# Patient Record
Sex: Male | Born: 1960 | Race: White | Hispanic: No | Marital: Single | Smoking: Current some day smoker
Health system: Southern US, Community
[De-identification: ages and names within clinical notes are randomized; demographics above are authoritative.]

---

## 2016-11-04 ENCOUNTER — Encounter (HOSPITAL_COMMUNITY): Payer: Self-pay | Admitting: Emergency Medicine

## 2016-11-04 ENCOUNTER — Emergency Department (HOSPITAL_COMMUNITY)
Admission: EM | Admit: 2016-11-04 | Discharge: 2016-11-04 | Disposition: A | Discharge status: Home / Self Care | Payer: Self-pay | Attending: Emergency Medicine | Admitting: Emergency Medicine

## 2016-11-04 ENCOUNTER — Emergency Department (HOSPITAL_COMMUNITY): Payer: Self-pay

## 2016-11-04 DIAGNOSIS — F17210 Nicotine dependence, cigarettes, uncomplicated: Secondary | ICD-10-CM | POA: Insufficient documentation

## 2016-11-04 DIAGNOSIS — R1013 Epigastric pain: Secondary | ICD-10-CM | POA: Insufficient documentation

## 2016-11-04 LAB — BASIC METABOLIC PANEL
Anion gap: 9 (ref 5–15)
BUN: 11 mg/dL (ref 6–20)
CALCIUM: 9 mg/dL (ref 8.9–10.3)
CO2: 22 mmol/L (ref 22–32)
CREATININE: 0.81 mg/dL (ref 0.61–1.24)
Chloride: 105 mmol/L (ref 101–111)
Glucose, Bld: 114 mg/dL — ABNORMAL HIGH (ref 65–99)
Potassium: 3.7 mmol/L (ref 3.5–5.1)
SODIUM: 136 mmol/L (ref 135–145)

## 2016-11-04 LAB — CBC
HCT: 45.1 % (ref 39.0–52.0)
Hemoglobin: 16.2 g/dL (ref 13.0–17.0)
MCH: 31.3 pg (ref 26.0–34.0)
MCHC: 35.9 g/dL (ref 30.0–36.0)
MCV: 87.2 fL (ref 78.0–100.0)
PLATELETS: 257 10*3/uL (ref 150–400)
RBC: 5.17 MIL/uL (ref 4.22–5.81)
RDW: 11.9 % (ref 11.5–15.5)
WBC: 8.5 10*3/uL (ref 4.0–10.5)

## 2016-11-04 LAB — I-STAT TROPONIN, ED
TROPONIN I, POC: 0 ng/mL (ref 0.00–0.08)
TROPONIN I, POC: 0.01 ng/mL (ref 0.00–0.08)

## 2016-11-04 LAB — HEPATIC FUNCTION PANEL
ALBUMIN: 4.3 g/dL (ref 3.5–5.0)
ALK PHOS: 70 U/L (ref 38–126)
ALT: 25 U/L (ref 17–63)
AST: 29 U/L (ref 15–41)
Bilirubin, Direct: <0.1 mg/dL — ABNORMAL LOW (ref 0.1–0.5)
TOTAL PROTEIN: 7.6 g/dL (ref 6.5–8.1)
Total Bilirubin: 0.8 mg/dL (ref 0.3–1.2)

## 2016-11-04 LAB — LIPASE, BLOOD: LIPASE: 27 U/L (ref 11–51)

## 2016-11-04 MED ORDER — PANTOPRAZOLE SODIUM 40 MG IV SOLR
40.0000 mg | Freq: Once | INTRAVENOUS | Status: AC
  Administered 2016-11-04: 40 mg via INTRAVENOUS
  Filled 2016-11-04: qty 40

## 2016-11-04 MED ORDER — OMEPRAZOLE 20 MG PO CPDR: 20.0000 mg | DELAYED_RELEASE_CAPSULE | Freq: Every day | ORAL | 0 refills | Status: AC

## 2016-11-04 MED ORDER — BUPIVACAINE HCL 0.25 % IJ SOLN: 50.0000 mL | Freq: Once | INTRAMUSCULAR | Status: DC

## 2016-11-04 NOTE — ED Triage Notes (Signed)
Chest pain started approx 0200 with arms shaking, pain in right arm and right side of neck, epigastric.

## 2016-11-04 NOTE — ED Triage Notes (Signed)
States currently in no pain, "arms feel numb"

## 2016-11-04 NOTE — ED Notes (Signed)
C/o epigastric pain , states he got up to go to the bathroom 4-5 times last pm states he had diarrhea, c/o tingling in hands and fingers, States the pain was intermittent and sharp in nature. Denies radiation denies sob or n/v

## 2016-11-04 NOTE — ED Provider Notes (Signed)
[facility]-EMERGENCY DEPT Provider Note   CSN: [id number] Arrival date & time: 11/04/16  0732     History   Chief Complaint Chief Complaint  Patient presents with  . Chest Pain    HPI Manuel Berry is a 56 y.o. male.  Patient is a 56 year old male who presents with chest pain. He states he woke up during the night several times because he was having some loose stools. He states during this time he noted some pain in his epigastrium. He also notes that both of his hands were tingling. There is no associated shortness of breath. No other chest pain. He felt like it was little knives stabbing him in the epigastrium. He denies any of those symptoms now. No abdominal pain currently. His finger still felt tingly bilaterally. There is no weakness in his extremities. No nausea or vomiting. He did drink several beers yesterday and the day before.  No history of heart disease in the past. He does have a family history and his dad had a heart attack at age 9. He is an occasional smoker. He denies any known history of heart disease, hypertension or hyperlipidemia although he has not had his levels checked in a while.      History reviewed. No pertinent past medical history.  There are no active problems to display for this patient.   History reviewed. No pertinent surgical history.     Home Medications    Prior to Admission medications   Medication Sig Start Date End Date Taking? Authorizing Provider  omeprazole (PRILOSEC) 20 MG capsule Take 1 capsule (20 mg total) by mouth daily. 11/04/16   Rolan Bucco, MD    Family History No family history on file.  Social History Social History  Substance Use Topics  . Smoking status: Current Some Day Smoker    Types: Cigarettes  . Smokeless tobacco: Never Used  . Alcohol use Yes     Comment: weekends     Allergies   Patient has no known allergies.   Review of Systems Review of Systems  Constitutional: Negative for chills,  diaphoresis, fatigue and fever.  HENT: Negative for congestion, rhinorrhea and sneezing.   Eyes: Negative.   Respiratory: Negative for cough, chest tightness and shortness of breath.   Cardiovascular: Positive for chest pain. Negative for leg swelling.  Gastrointestinal: Positive for abdominal pain. Negative for blood in stool, diarrhea, nausea and vomiting.  Genitourinary: Negative for difficulty urinating, flank pain, frequency and hematuria.  Musculoskeletal: Negative for arthralgias and back pain.  Skin: Negative for rash.  Neurological: Positive for numbness. Negative for dizziness, speech difficulty, weakness and headaches.     Physical Exam Updated Vital Signs BP (!) 174/105   Pulse 72   Temp (!) 97.2 F (36.2 C) (Oral)   Resp (!) 22   Ht 5\' 10"  (1.778 m)   Wt 77.1 kg (170 lb)   SpO2 98%   BMI 24.39 kg/m   Physical Exam  Constitutional: He is oriented to person, place, and time. He appears well-developed and well-nourished.  HENT:  Head: Normocephalic and atraumatic.  Eyes: Pupils are equal, round, and reactive to light.  Neck: Normal range of motion. Neck supple.  Cardiovascular: Normal rate, regular rhythm and normal heart sounds.   Pulmonary/Chest: Effort normal and breath sounds normal. No respiratory distress. He has no wheezes. He has no rales. He exhibits no tenderness.  Abdominal: Soft. Bowel sounds are normal. There is no tenderness. There is no rebound and no guarding.  Musculoskeletal: Normal range of motion. He exhibits no edema.  Lymphadenopathy:    He has no cervical adenopathy.  Neurological: He is alert and oriented to person, place, and time.  Skin: Skin is warm and dry. No rash noted.  Psychiatric: He has a normal mood and affect.     ED Treatments / Results  Labs (all labs ordered are listed, but only abnormal results are displayed) Labs Reviewed  BASIC METABOLIC PANEL - Abnormal; Notable for the following:       Result Value   Glucose, Bld  114 (*)    All other components within normal limits  HEPATIC FUNCTION PANEL - Abnormal; Notable for the following:    Bilirubin, Direct <0.1 (*)    All other components within normal limits  CBC  LIPASE, BLOOD  I-STAT TROPONIN, ED  I-STAT TROPONIN, ED    EKG  EKG Interpretation  Date/Time:  Monday November 04 2016 07:38:24 EDT Ventricular Rate:  68 PR Interval:  126 QRS Duration: 106 QT Interval:  434 QTC Calculation: 461 R Axis:   2 Text Interpretation:  Normal sinus rhythm Normal ECG Confirmed by Rolan BuccoBelfi, Breta Demedeiros [phone]([id number]) on 11/04/2016 7:55:07 AM       Radiology Dg Chest 2 View  Result Date: 11/04/2016 CLINICAL DATA:  Chest pain EXAM: CHEST  2 VIEW COMPARISON:  None. FINDINGS: The lungs are clear. Heart size and pulmonary vascularity are normal. No adenopathy. No pneumothorax. No bone lesions. IMPRESSION: No edema or consolidation. Electronically Signed   By: Bretta BangWilliam  Woodruff III M.D.   On: 11/04/2016 07:58    Procedures Procedures (including critical care time)  Medications Ordered in ED Medications  pantoprazole (PROTONIX) injection 40 mg (40 mg Intravenous Given 11/04/16 0931)     Initial Impression / Assessment and Plan / ED Course  I have reviewed the triage vital signs and the nursing notes.  Pertinent labs & imaging results that were available during my care of the patient were reviewed by me and considered in my medical decision making (see chart for details).     Patient is a 56 year old male who presents with some epigastric pain. There is no shortness of breath. No exertional symptoms. No ischemic changes on EKG. He's had 2 negative troponins. He has a low heart score. I feel that he can be discharged with outpatient follow-up. I will start him on omeprazole. I gave him a list of resources for possible outpatient follow-up. He was given strict return precautions. His blood pressure is noted to be elevated. His discharge blood pressure was 152/94. I advised him to  pick up a blood pressure machine that he can check his blood pressures at home and to bring this record to a primary care doctor, particularly if it remains high.  Final Clinical Impressions(s) / ED Diagnoses   Final diagnoses:  Epigastric pain    New Prescriptions New Prescriptions   OMEPRAZOLE (PRILOSEC) 20 MG CAPSULE    Take 1 capsule (20 mg total) by mouth daily.     Rolan BuccoBelfi, Lakie Mclouth, MD 11/04/16 1155

## 2018-02-26 IMAGING — DX DG CHEST 2V
2 series · 2 of 2 positions shown · non-contrast
Comparison: None.

CLINICAL DATA: Chest pain

EXAM:
CHEST  2 VIEW

[w chest pa]
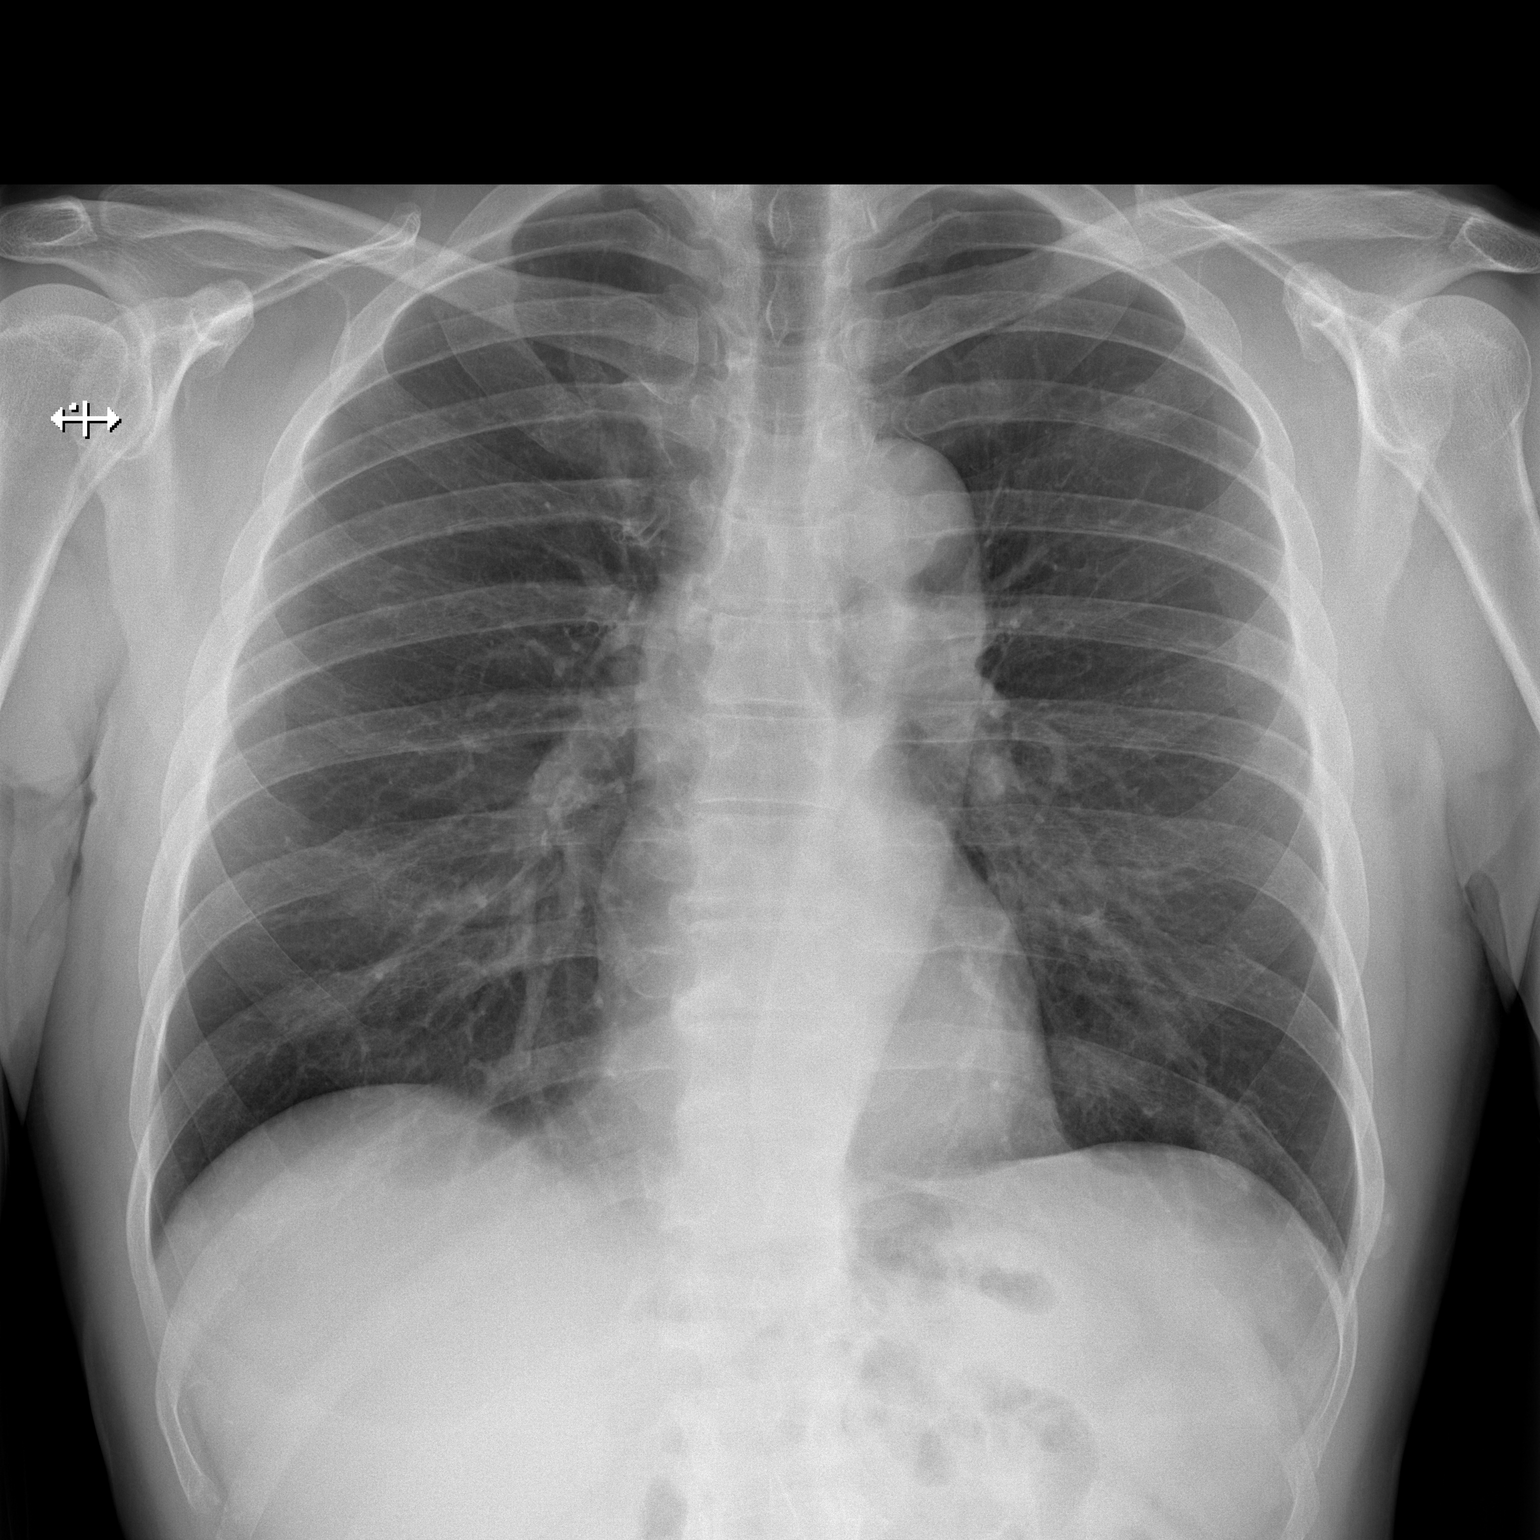

[w chest lat]
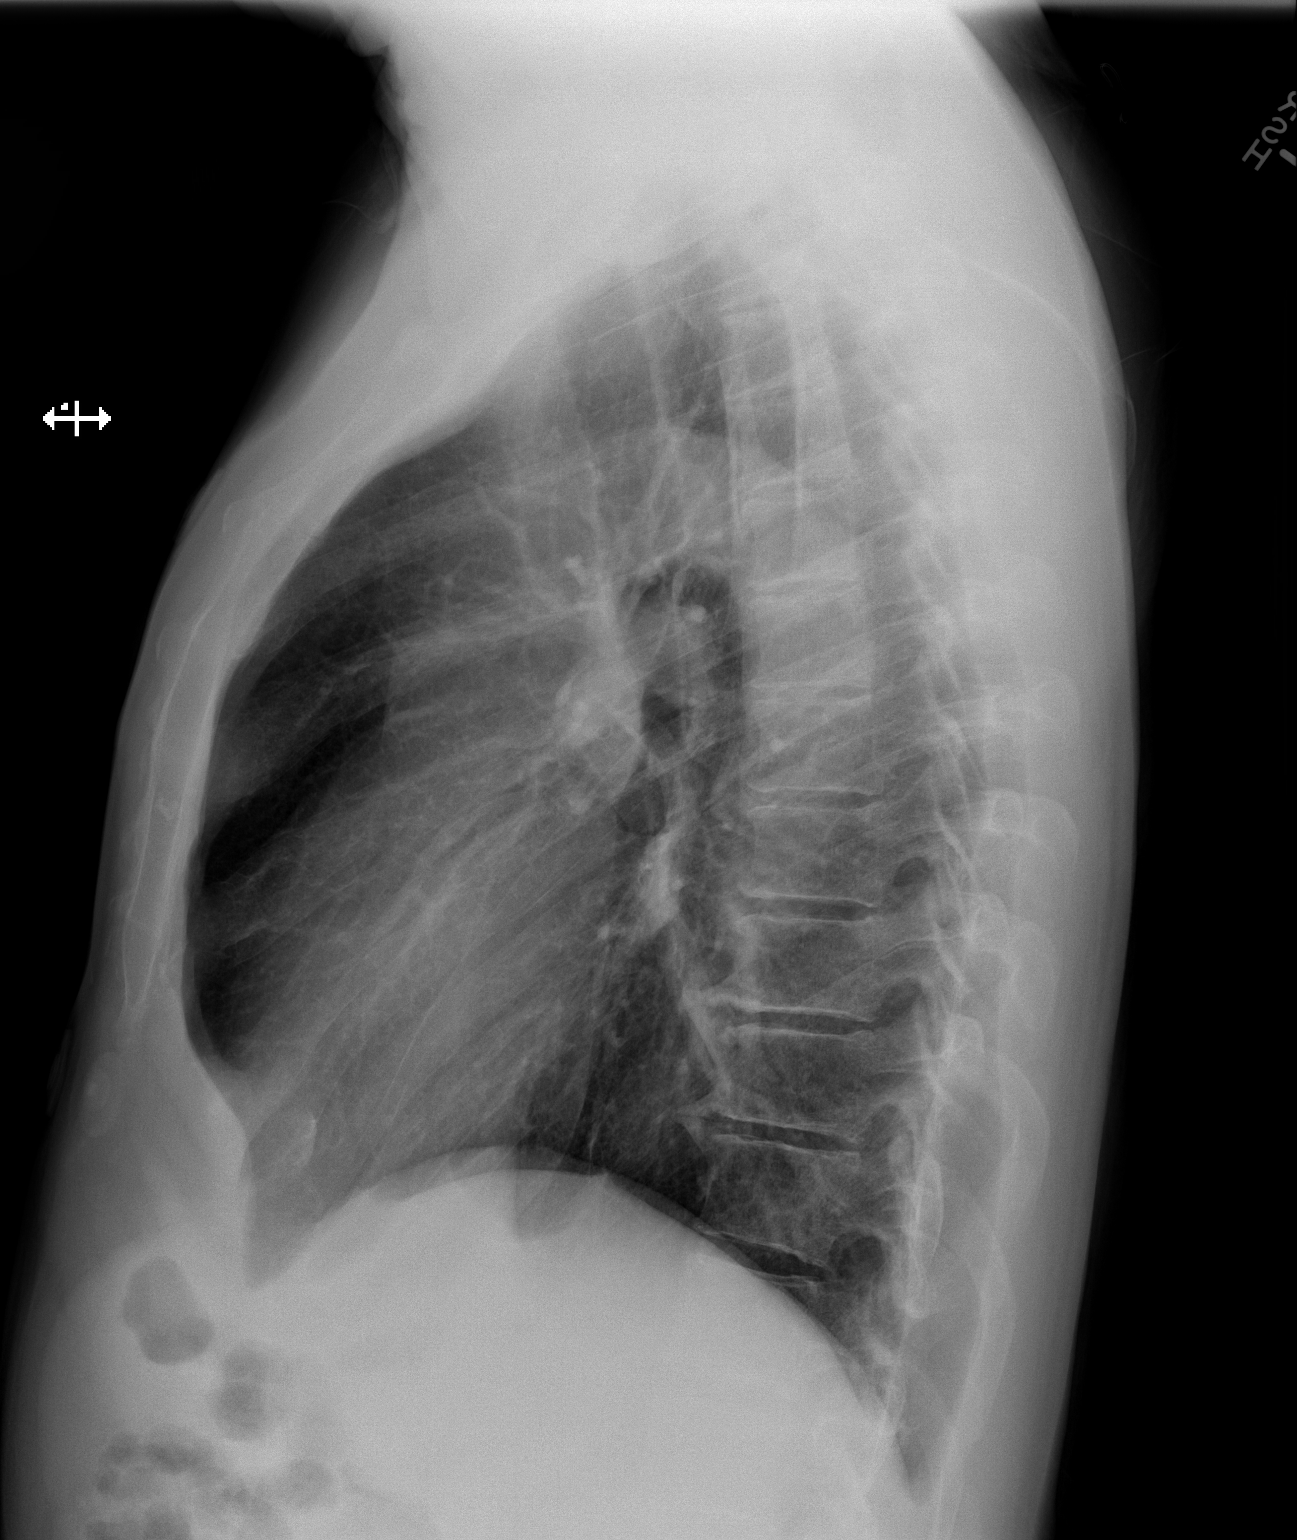

[2 of 2 positions shown; findings below may reference images not displayed]

FINDINGS: The lungs are clear. Heart size and pulmonary vascularity are
normal. No adenopathy. No pneumothorax. No bone lesions.
IMPRESSION: No edema or consolidation.
